# Patient Record
Sex: Male | Born: 1985 | Race: Black or African American | Hispanic: No | Smoking: Current every day smoker
Health system: Southern US, Community
[De-identification: ages and names within clinical notes are randomized; demographics above are authoritative.]

## PROBLEM LIST (undated history)

## (undated) DIAGNOSIS — R7989 Other specified abnormal findings of blood chemistry: Secondary | ICD-10-CM

## (undated) DIAGNOSIS — W19XXXA Unspecified fall, initial encounter: Secondary | ICD-10-CM

## (undated) DIAGNOSIS — M2141 Flat foot [pes planus] (acquired), right foot: Secondary | ICD-10-CM

## (undated) DIAGNOSIS — R55 Syncope and collapse: Secondary | ICD-10-CM

## (undated) DIAGNOSIS — R778 Other specified abnormalities of plasma proteins: Secondary | ICD-10-CM

## (undated) DIAGNOSIS — J9859 Other diseases of mediastinum, not elsewhere classified: Secondary | ICD-10-CM

## (undated) DIAGNOSIS — S069X9A Unspecified intracranial injury with loss of consciousness of unspecified duration, initial encounter: Secondary | ICD-10-CM

## (undated) DIAGNOSIS — K5090 Crohn's disease, unspecified, without complications: Secondary | ICD-10-CM

## (undated) DIAGNOSIS — M545 Low back pain, unspecified: Secondary | ICD-10-CM

## (undated) DIAGNOSIS — F4310 Post-traumatic stress disorder, unspecified: Secondary | ICD-10-CM

## (undated) HISTORY — DX: Low back pain, unspecified: M54.50

## (undated) HISTORY — DX: Flat foot (pes planus) (acquired), right foot: M21.41

## (undated) HISTORY — DX: Crohn's disease, unspecified, without complications: K50.90

## (undated) HISTORY — DX: Other diseases of mediastinum, not elsewhere classified: J98.59

## (undated) HISTORY — DX: Syncope and collapse: R55

## (undated) HISTORY — DX: Unspecified intracranial injury with loss of consciousness of unspecified duration, initial encounter: S06.9X9A

## (undated) HISTORY — DX: Unspecified fall, initial encounter: W19.XXXA

## (undated) HISTORY — DX: Other specified abnormalities of plasma proteins: R77.8

## (undated) HISTORY — DX: Post-traumatic stress disorder, unspecified: F43.10

## (undated) HISTORY — DX: Other specified abnormal findings of blood chemistry: R79.89

---

## 2019-08-15 ENCOUNTER — Other Ambulatory Visit: Payer: Self-pay

## 2019-08-15 DIAGNOSIS — Z20822 Contact with and (suspected) exposure to covid-19: Secondary | ICD-10-CM

## 2019-08-16 LAB — NOVEL CORONAVIRUS, NAA: SARS-CoV-2, NAA: NOT DETECTED

## 2020-05-02 ENCOUNTER — Other Ambulatory Visit: Payer: Self-pay

## 2020-05-29 DIAGNOSIS — J9859 Other diseases of mediastinum, not elsewhere classified: Secondary | ICD-10-CM | POA: Insufficient documentation

## 2020-05-30 ENCOUNTER — Other Ambulatory Visit: Payer: Self-pay | Admitting: *Deleted

## 2020-05-30 ENCOUNTER — Other Ambulatory Visit: Payer: Self-pay

## 2020-05-30 ENCOUNTER — Institutional Professional Consult (permissible substitution) (INDEPENDENT_AMBULATORY_CARE_PROVIDER_SITE_OTHER): Payer: No Typology Code available for payment source | Admitting: Thoracic Surgery (Cardiothoracic Vascular Surgery)

## 2020-05-30 ENCOUNTER — Encounter: Payer: Self-pay | Admitting: Thoracic Surgery (Cardiothoracic Vascular Surgery)

## 2020-05-30 VITALS — BP 140/67 | HR 90 | Temp 98.2°F | Resp 20 | Ht 73.0 in | Wt 200.0 lb

## 2020-05-30 DIAGNOSIS — J9859 Other diseases of mediastinum, not elsewhere classified: Secondary | ICD-10-CM | POA: Diagnosis not present

## 2020-05-30 NOTE — Progress Notes (Signed)
[address]       [place] [address]             [phone]                    Roger Stone [facility] Medical Record #[id number] Date of Birth: 1986-08-21  Referring: Concha Pyo, MD Primary Care: Patient, No Pcp Per Primary Cardiologist: No primary care provider on file.  Chief Complaint:    Chief Complaint  Patient presents with  . Mediastinal Mass    Surgical consult, CTA chest and Head CT 04/19/20     History of Present Illness:    Roger Stone 34 y.o. male referred for evaluation of anterior mediastinal mass.  In August of this year he had a syncopal episode after mowing the lawn.  Was evaluated on cross-sectional imaging he was noted to have anterior mediastinal fullness concerning for a thymic hematoma.  On review of systems he notes that he has lost approximately 8 pounds over the last year.  He states that he has a good appetite but has not really been working out much and so is unclear as to why he has lost this much weight.  He does endorse some night sweats but states that this is related to his PTSD.  His night sweats have gotten better since he started therapy.  He denies any fevers.  He also denies any abnormal bumps or fullness in any of his nodal basins.    Smoking Hx: Smokes about 5 cigarettes a day.   Zubrod Score: At the time of surgery this patient's most appropriate activity status/level should be described as: [x]     0    Normal activity, no symptoms []     1    Restricted in physical strenuous activity but ambulatory, able to do out light work []     2    Ambulatory and capable of self care, unable to do work activities, up and about               >50 % of waking hours                              []     3    Only limited self care, in bed greater than 50% of waking hours []     4    Completely disabled, no self care, confined to bed or chair []     5    Moribund   Past Medical History:  Diagnosis Date  . Crohn disease (HCC)     . Elevated troponin   . Fall   . Flat feet   . Head injury, closed, with LOC of unknown duration (HCC)   . Low back pain   . Mediastinal mass   . PTSD (post-traumatic stress disorder)   . Syncope      No family history on file.   Social History   Tobacco Use  Smoking Status Current Every Day Smoker    Social History   Substance and Sexual Activity  Alcohol Use Not on file     No Known Allergies  Current Outpatient Medications  Medication Sig Dispense Refill  . acetaminophen (TYLENOL) 325 MG tablet Take 650 mg by mouth every 6 (six) hours as needed.    ibuprofen (ADVIL) 400 MG tablet Take 400 mg by mouth every 6 (six) hours as needed.  No current facility-administered medications for this visit.    Review of Systems  Constitutional: Positive for diaphoresis and weight loss. Negative for fever and malaise/fatigue.  Respiratory: Negative.   Cardiovascular: Negative.   Neurological: Negative.   Endo/Heme/Allergies: Negative.   Psychiatric/Behavioral: The patient is nervous/anxious.      PHYSICAL EXAMINATION: BP 140/67   Pulse 90   Temp 98.2 F (36.8 C) (Skin)   Resp 20   Ht 6\' 1"  (1.854 m)   Wt 200 lb (90.7 kg)   SpO2 99% Comment: RA  BMI 26.39 kg/m  Physical Exam Constitutional:      General: He is not in acute distress.    Appearance: Normal appearance. He is not ill-appearing or toxic-appearing.  HENT:     Head: Normocephalic and atraumatic.  Eyes:     Extraocular Movements: Extraocular movements intact.     Conjunctiva/sclera: Conjunctivae normal.  Cardiovascular:     Rate and Rhythm: Normal rate and regular rhythm.     Heart sounds: No murmur heard.   Pulmonary:     Effort: Respiratory distress present.  Abdominal:     General: Abdomen is flat. There is no distension.     Palpations: Abdomen is soft.  Musculoskeletal:        General: Normal range of motion.     Cervical back: Normal range of motion and neck supple. No tenderness.   Lymphadenopathy:     Head:     Right side of head: No submental, submandibular or tonsillar adenopathy.     Left side of head: No submental, submandibular or tonsillar adenopathy.     Cervical: No cervical adenopathy.     Right cervical: No superficial cervical adenopathy.    Left cervical: No superficial cervical adenopathy.     Upper Body:     Right upper body: No supraclavicular or axillary adenopathy.     Left upper body: No axillary or pectoral adenopathy.     Lower Body: No right inguinal adenopathy. No left inguinal adenopathy.  Neurological:     Mental Status: He is alert.     Diagnostic Studies & Laboratory data:     Recent Radiology Findings:   No results found.     I have independently reviewed the above radiology studies  and reviewed the findings with the patient.   Recent Lab Findings: No results found for: WBC, HGB, HCT, PLT, GLUCOSE, CHOL, TRIG, HDL, LDLDIRECT, LDLCALC, ALT, AST, NA, K, CL, CREATININE, BUN, CO2, TSH, INR, GLUF, HGBA1C   Problem List: Anterior mediastinal mass.  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  He is also had an 80 pound weight loss over the past year.  We will need a full evaluation to rule out lymphoma, thymoma, germ cell tumor, or thymic cancer.  I have ordered tumor markers as well as a PET/CT and will see him back in clinic once all these have resulted.     I  spent 40 minutes with  the patient face to face and greater then 50% of the time was spent in counseling and coordination of care.    31 05/30/2020 4:54 PM

## 2020-06-03 ENCOUNTER — Other Ambulatory Visit: Payer: Self-pay | Admitting: Thoracic Surgery (Cardiothoracic Vascular Surgery)

## 2020-06-06 LAB — LACTATE DEHYDROGENASE: LDH: 153 U/L (ref 100–220)

## 2020-06-06 LAB — HCG, TOTAL, QUANTITATIVE: hCG, Beta Chain, Quant, S: <3 m[IU]/mL (ref <5)

## 2020-06-06 LAB — ACETYLCHOLINE RECEPTOR, BLOCKING: ACHR Blocking Abs: <15 % Inhibition (ref <15)

## 2020-06-06 LAB — AFP TUMOR MARKER: AFP-Tumor Marker: 4.1 ng/mL (ref <6.1)

## 2020-06-11 ENCOUNTER — Ambulatory Visit (HOSPITAL_COMMUNITY)
Admission: RE | Admit: 2020-06-11 | Discharge: 2020-06-11 | Disposition: A | Discharge status: Home / Self Care | Payer: No Typology Code available for payment source | Source: Ambulatory Visit | Attending: Thoracic Surgery (Cardiothoracic Vascular Surgery) | Admitting: Thoracic Surgery (Cardiothoracic Vascular Surgery)

## 2020-06-11 ENCOUNTER — Other Ambulatory Visit: Payer: Self-pay

## 2020-06-11 DIAGNOSIS — J9859 Other diseases of mediastinum, not elsewhere classified: Secondary | ICD-10-CM | POA: Insufficient documentation

## 2020-06-11 LAB — GLUCOSE, CAPILLARY: Glucose-Capillary: 93 mg/dL (ref 70–99)

## 2020-06-11 MED ORDER — FLUDEOXYGLUCOSE F - 18 (FDG) INJECTION
9.9000 | Freq: Once | INTRAVENOUS | Status: AC | PRN
  Administered 2020-06-11: 9.9 via INTRAVENOUS

## 2020-06-12 ENCOUNTER — Encounter: Payer: Self-pay | Admitting: Thoracic Surgery (Cardiothoracic Vascular Surgery)

## 2020-06-12 ENCOUNTER — Other Ambulatory Visit: Payer: Self-pay | Admitting: Thoracic Surgery (Cardiothoracic Vascular Surgery)

## 2020-06-12 ENCOUNTER — Ambulatory Visit (INDEPENDENT_AMBULATORY_CARE_PROVIDER_SITE_OTHER): Payer: No Typology Code available for payment source | Admitting: Thoracic Surgery (Cardiothoracic Vascular Surgery)

## 2020-06-12 VITALS — BP 159/76 | HR 97 | Temp 97.4°F | Resp 18 | Ht 73.0 in | Wt 195.6 lb

## 2020-06-12 DIAGNOSIS — J9859 Other diseases of mediastinum, not elsewhere classified: Secondary | ICD-10-CM | POA: Diagnosis not present

## 2020-06-12 NOTE — Progress Notes (Signed)
     [address]       [name] [address]             [phone]       Mr. Weisbecker comes in for review of all of his studies.  His LDH, AFP, and beta-hCG were all within normal limits.  His PET scan showed an enlarged thymus gland with mild FDG uptake.  There was no significant lymphadenopathy that was avid on PET scan.  I discussed these results with him and his wife.  I think that lymphoma is lower on the differential.  We need to delineate whether he has thymic hyperplasia or thymoma.  Again his only symptoms are an 80 pound weight loss over the last year, and night sweats which his wife states has been present for the past 9 years.  He also has a history of obstructive sleep apnea as well as PTSD.  I have ordered an MRI to focus on the thymus gland.  Once completed his case will be presented at our tumor board.  I will see him back to discuss the results.  Kember Boch Keane Scrape

## 2020-06-17 ENCOUNTER — Encounter (HOSPITAL_COMMUNITY): Payer: Self-pay

## 2020-06-17 ENCOUNTER — Other Ambulatory Visit: Payer: Self-pay | Admitting: Thoracic Surgery (Cardiothoracic Vascular Surgery)

## 2020-06-17 ENCOUNTER — Ambulatory Visit (HOSPITAL_COMMUNITY)
Admission: RE | Admit: 2020-06-17 | Discharge: 2020-06-17 | Disposition: A | Discharge status: Home / Self Care | Payer: No Typology Code available for payment source | Source: Ambulatory Visit | Attending: Thoracic Surgery (Cardiothoracic Vascular Surgery) | Admitting: Thoracic Surgery (Cardiothoracic Vascular Surgery)

## 2020-06-17 ENCOUNTER — Other Ambulatory Visit: Payer: Self-pay

## 2020-06-17 DIAGNOSIS — J9859 Other diseases of mediastinum, not elsewhere classified: Secondary | ICD-10-CM

## 2020-06-17 NOTE — Progress Notes (Signed)
Spoke to Dr. Ashley Murrain in radiology about exam order vs. Reason for exam. Per Dr. Ashley Murrain, exam needs to be MRI Chest W/WO. Exam was ordered MRA Chest W/WO. Dr. Isidore Moos made aware. Pt will need to reschedule after order is changed.

## 2020-06-18 ENCOUNTER — Other Ambulatory Visit: Payer: Self-pay | Admitting: Thoracic Surgery (Cardiothoracic Vascular Surgery)

## 2020-06-20 ENCOUNTER — Telehealth: Payer: No Typology Code available for payment source | Admitting: Thoracic Surgery (Cardiothoracic Vascular Surgery)

## 2020-06-20 ENCOUNTER — Other Ambulatory Visit: Payer: Self-pay

## 2020-06-21 ENCOUNTER — Ambulatory Visit (HOSPITAL_COMMUNITY)
Admission: RE | Admit: 2020-06-21 | Discharge: 2020-06-21 | Disposition: A | Discharge status: Home / Self Care | Payer: No Typology Code available for payment source | Source: Ambulatory Visit | Attending: Thoracic Surgery (Cardiothoracic Vascular Surgery) | Admitting: Thoracic Surgery (Cardiothoracic Vascular Surgery)

## 2020-06-21 ENCOUNTER — Other Ambulatory Visit: Payer: Self-pay

## 2020-06-21 DIAGNOSIS — J9859 Other diseases of mediastinum, not elsewhere classified: Secondary | ICD-10-CM | POA: Diagnosis not present

## 2020-06-21 MED ORDER — GADOBUTROL 1 MMOL/ML IV SOLN
7.5000 mL | Freq: Once | INTRAVENOUS | Status: AC | PRN
  Administered 2020-06-21: 7.5 mL via INTRAVENOUS

## 2020-06-27 ENCOUNTER — Telehealth: Payer: No Typology Code available for payment source | Admitting: Thoracic Surgery (Cardiothoracic Vascular Surgery)

## 2020-08-15 ENCOUNTER — Other Ambulatory Visit: Payer: Self-pay | Admitting: Thoracic Surgery (Cardiothoracic Vascular Surgery)

## 2020-08-15 DIAGNOSIS — J9859 Other diseases of mediastinum, not elsewhere classified: Secondary | ICD-10-CM

## 2020-09-09 ENCOUNTER — Other Ambulatory Visit: Payer: No Typology Code available for payment source

## 2020-09-10 ENCOUNTER — Ambulatory Visit
Admission: RE | Admit: 2020-09-10 | Discharge: 2020-09-10 | Disposition: A | Discharge status: Home / Self Care | Payer: No Typology Code available for payment source | Source: Ambulatory Visit | Attending: Thoracic Surgery (Cardiothoracic Vascular Surgery) | Admitting: Thoracic Surgery (Cardiothoracic Vascular Surgery)

## 2020-09-10 DIAGNOSIS — J9859 Other diseases of mediastinum, not elsewhere classified: Secondary | ICD-10-CM

## 2020-09-10 MED ORDER — IOPAMIDOL (ISOVUE-300) INJECTION 61%
75.0000 mL | Freq: Once | INTRAVENOUS | Status: AC | PRN
  Administered 2020-09-10: 75 mL via INTRAVENOUS

## 2020-09-26 ENCOUNTER — Other Ambulatory Visit: Payer: Self-pay

## 2020-09-26 ENCOUNTER — Encounter: Payer: Self-pay | Admitting: Thoracic Surgery (Cardiothoracic Vascular Surgery)

## 2020-09-26 ENCOUNTER — Telehealth (INDEPENDENT_AMBULATORY_CARE_PROVIDER_SITE_OTHER): Payer: No Typology Code available for payment source | Admitting: Thoracic Surgery (Cardiothoracic Vascular Surgery)

## 2020-09-26 DIAGNOSIS — J9859 Other diseases of mediastinum, not elsewhere classified: Secondary | ICD-10-CM | POA: Diagnosis not present

## 2020-09-26 NOTE — Progress Notes (Signed)
     [address]       [name] [address]             [phone]       Today's visit was completed via a real-time telehealth (see specific modality noted below). The patient/authorized person provided oral consent at the time of the visit to engage in a telemedicine encounter with the present provider at [facility]. The patient/authorized person was informed of the potential benefits, limitations, and risks of telemedicine. The patient/authorized person expressed understanding that the laws that protect confidentiality also apply to telemedicine. The patient/authorized person acknowledged understanding that telemedicine does not provide emergency services and that he or she would need to call 911 or proceed to the nearest hospital for help if such a need arose.  . Total time spent in the clinical discussion 5 minutes. . Telehealth Modality: Phone visit (audio only)  I had a telephone visit with Roger Stone.  I explained the results of his CT scan.  The mediastinal mass is slightly smaller but it is recommended that he undergo repeat imaging for further surveillance in 3 months.  Patient states that he has not had any new symptoms or complaints since his last visit.  He is agreeable with another scan in the next 3 months.

## 2020-11-13 ENCOUNTER — Other Ambulatory Visit: Payer: Self-pay | Admitting: Thoracic Surgery (Cardiothoracic Vascular Surgery)

## 2020-11-13 DIAGNOSIS — J9859 Other diseases of mediastinum, not elsewhere classified: Secondary | ICD-10-CM

## 2021-01-02 ENCOUNTER — Other Ambulatory Visit: Payer: No Typology Code available for payment source

## 2021-01-02 ENCOUNTER — Telehealth: Payer: Self-pay | Admitting: Thoracic Surgery (Cardiothoracic Vascular Surgery)

## 2021-11-29 IMAGING — PT NM PET TUM IMG INITIAL (PI) SKULL BASE T - THIGH
7 series · 25 of 25 positions shown · non-contrast
Comparison: None.

CLINICAL DATA: Initial treatment strategy for mediastinal mass.

EXAM:
NUCLEAR MEDICINE PET SKULL BASE TO THIGH
TECHNIQUE: 9.95 mCi F-18 FDG was injected intravenously. Full-ring PET imaging
was performed from the skull base to thigh after the radiotracer. CT
data was obtained and used for attenuation correction and anatomic
localization.
Fasting blood glucose: 93 mg/dl

[Series 3: pet sk_thigh ac · axial · 5.0mm · 4.07mm/px · z∈[-945,+11]mm · 5 of 240 slices shown]
[im 1/240]
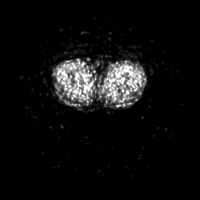
[im 60/240]
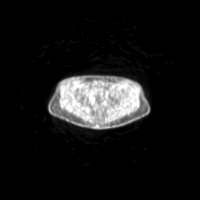
[im 120/240]
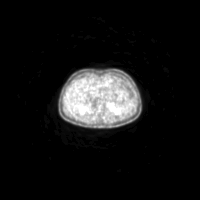
[im 180/240]
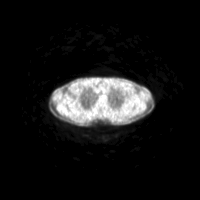
[im 240/240]
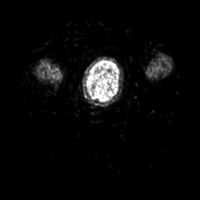

[Series 4: ct sk_thigh 5.0 bf37 · axial · 5.0mm · 0.98mm/px · z∈[-945,+11]mm · 5 of 240 slices shown]
[im 1/240]
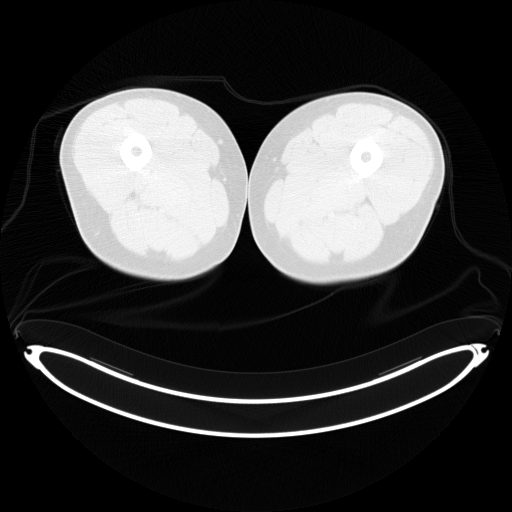
[im 60/240]
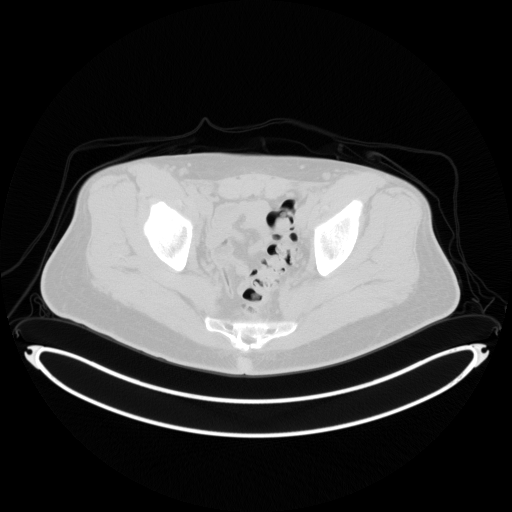
[im 120/240]
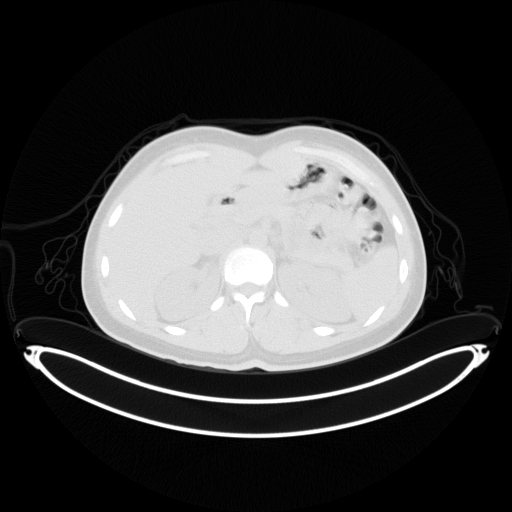
[im 180/240]
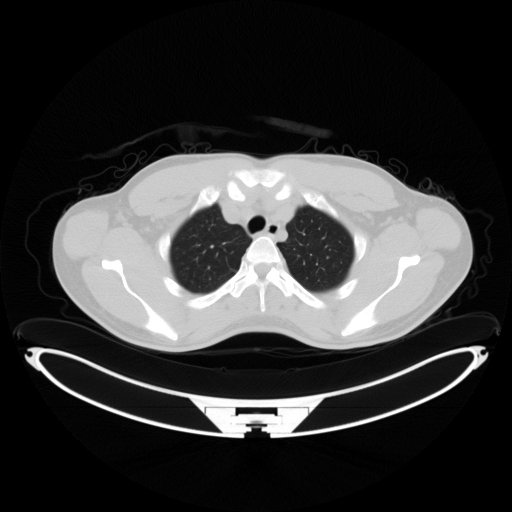
[im 240/240  brain]
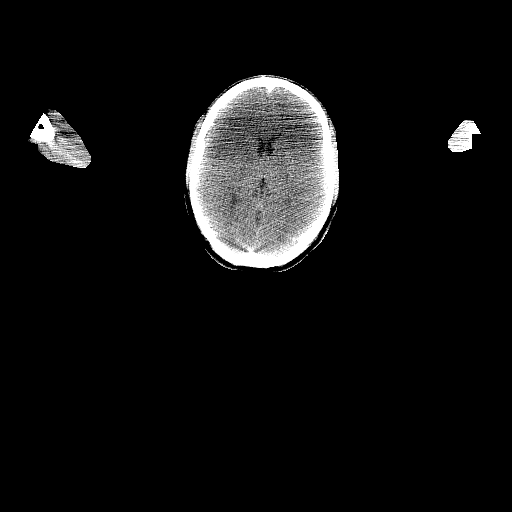

[Series 5: pet sk_thigh nac · axial · 5.0mm · 4.07mm/px · z∈[-945,+11]mm · 6 of 240 slices shown]
[im 1/240]
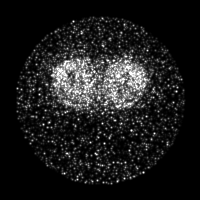
[im 48/240]
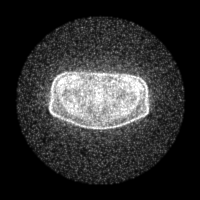
[im 96/240]
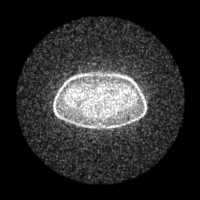
[im 144/240]
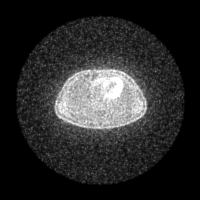
[im 192/240]
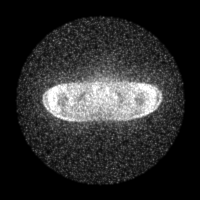
[im 240/240]
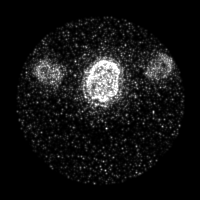

[Series 8: ct sk_thigh 5.0 br59 lung_bone · axial · 5.0mm · 0.73mm/px · z∈[-471,-163]mm · 2 of 78 slices shown]
[im 1/78]
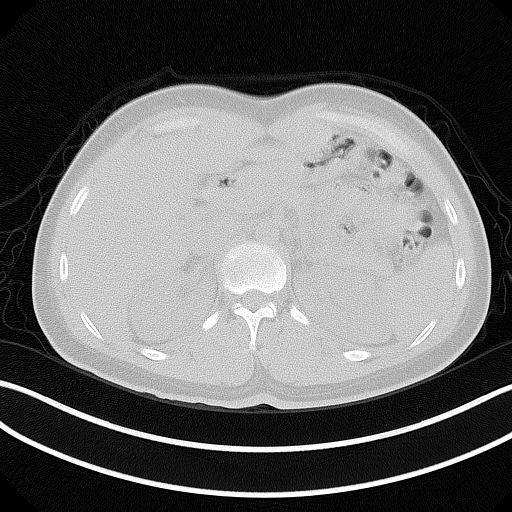
[im 78/78]
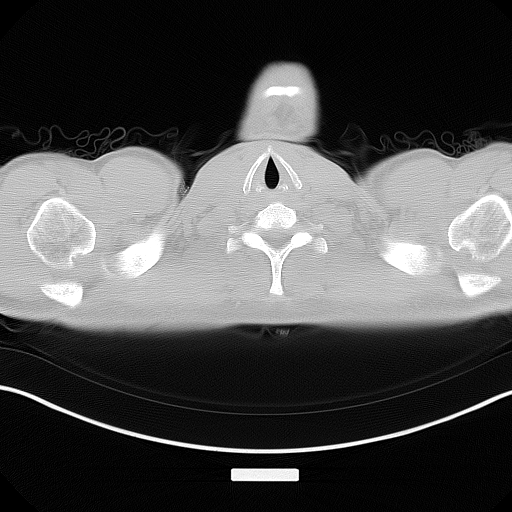

[Series 603: <mip collection> · coronal · 1.98mm/px · 1 of 32 slices shown]
[im 1/32]
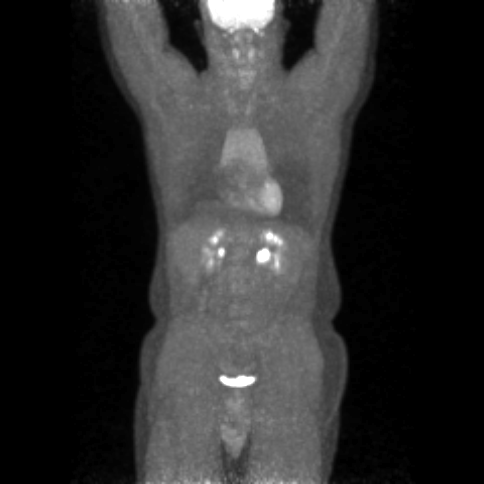

[Series 604: fused cor · 1 of 21 slices shown]
[im 1/21]
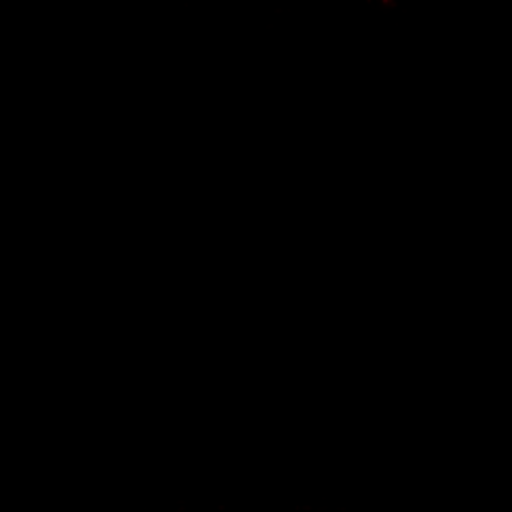

[Series 605: range-ct sk_thigh 5.0 bf37-tra-<alpha range> · 5 of 222 slices shown]
[im 1/222]
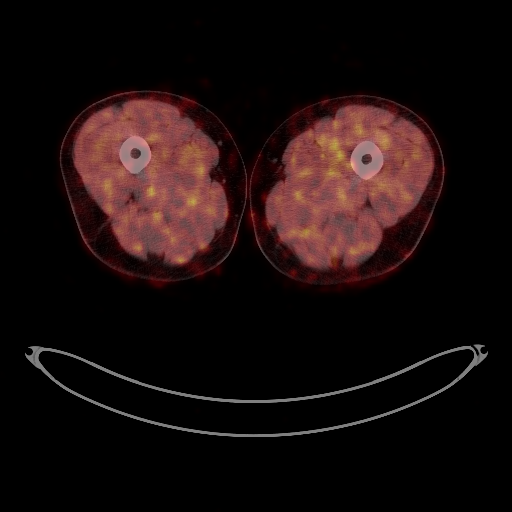
[im 56/222]
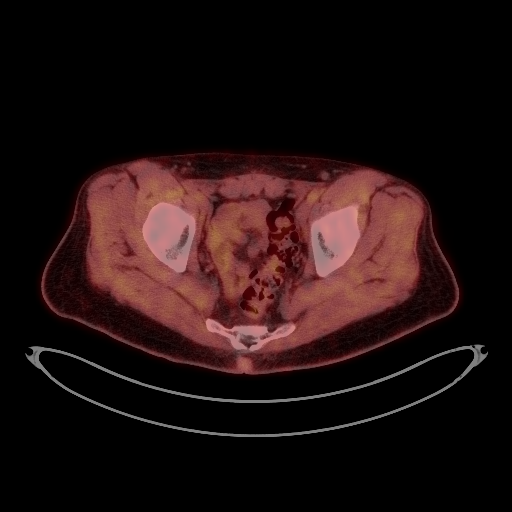
[im 111/222]
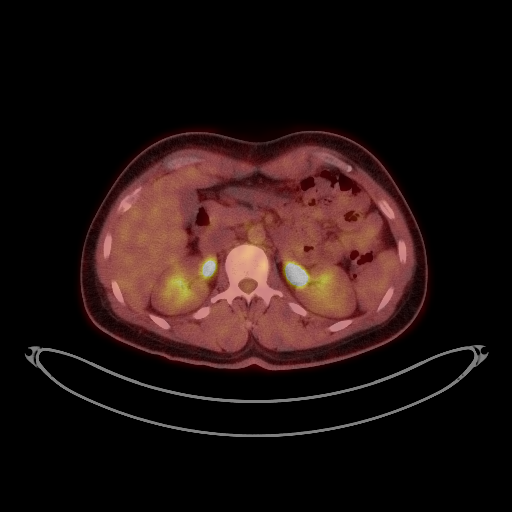
[im 166/222]
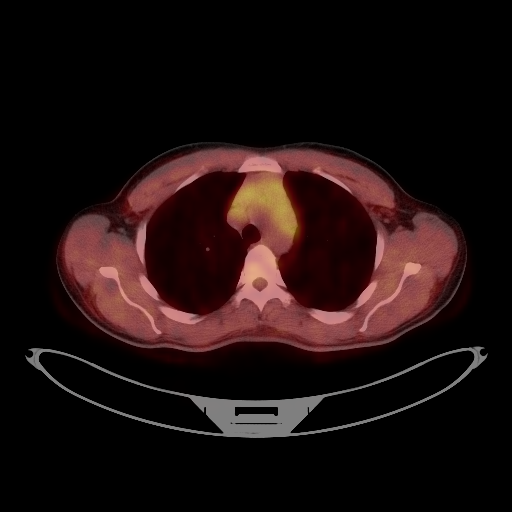
[im 222/222]
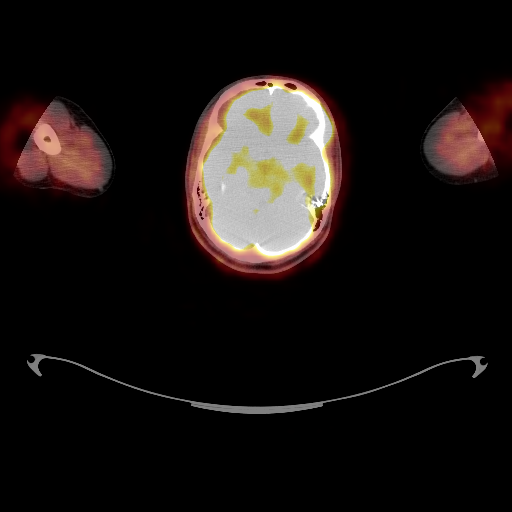

[25 of 25 positions shown; findings below may reference images not displayed]

FINDINGS: Mediastinal blood pool activity: SUV max

Liver activity: SUV max NA

NECK: No hypermetabolic lymph nodes in the neck.

Incidental CT findings: none

CHEST: There are no enlarged or FDG avid axillary, supraclavicular,
mediastinal or hilar lymph nodes. There is abnormal increased soft
tissue within the anterior mediastinum is identified, which appears
non lobular and conforms to the expected configuration of the thymic
gland. The left lobe has a width of 7.3 cm and a thickness of
cm, image 76/4. The right lobe has a width of 6.2 cm and a maximum
thickness of 2.7 cm. The corresponding SUV max is equal to 3.69.

Incidental CT findings: No pleural effusion, airspace consolidation
or atelectasis. Small nonspecific subpleural nodule in the right
middle lobe measures 3 mm, image 56/8. Subpleural nodular density
within the medial aspect of the right apex has a mean diameter of 5
mm, image [DATE].

ABDOMEN/PELVIS: No abnormal FDG uptake within the liver, pancreas,
spleen, or adrenal glands. No FDG avid abdominopelvic lymph nodes
identified.

Incidental CT findings: none

SKELETON: No focal hypermetabolic activity to suggest skeletal
metastasis.

Incidental CT findings: none
IMPRESSION: 1. There is abnormal increased soft tissue within the anterior
mediastinum which conforms to the normal configuration of the thymic
gland without focal nodularity. Mild FDG uptake is identified on the
PET images within SUV max of 3.69. Normal thymic gland typically has
SUV max of 1.0-1.8. Primary differential considerations include
thymic hyperplasia, lymphoma, or thymoma. If further imaging is
clinically indicated MRI of the chest with inphase and out of phase
sequences may be helpful to distinguish between thymic hyperplasia
and thymic neoplasm. Additionally, clinical correlation for any
causes for thymic hyperplasia is advised including.

## 2023-09-12 ENCOUNTER — Other Ambulatory Visit (HOSPITAL_COMMUNITY): Payer: Self-pay | Admitting: Nurse Practitioner

## 2023-09-12 DIAGNOSIS — E0590 Thyrotoxicosis, unspecified without thyrotoxic crisis or storm: Secondary | ICD-10-CM

## 2023-09-26 ENCOUNTER — Encounter (HOSPITAL_COMMUNITY): Payer: No Typology Code available for payment source

## 2023-09-26 ENCOUNTER — Encounter (HOSPITAL_COMMUNITY): Admission: RE | Admit: 2023-09-26 | Payer: No Typology Code available for payment source | Source: Ambulatory Visit

## 2023-09-27 ENCOUNTER — Encounter (HOSPITAL_COMMUNITY): Payer: No Typology Code available for payment source

## 2023-10-17 ENCOUNTER — Encounter (HOSPITAL_COMMUNITY)
Admission: RE | Admit: 2023-10-17 | Discharge: 2023-10-17 | Disposition: A | Discharge status: Home / Self Care | Payer: No Typology Code available for payment source | Source: Ambulatory Visit | Attending: Nurse Practitioner | Admitting: Nurse Practitioner

## 2023-10-17 ENCOUNTER — Ambulatory Visit (HOSPITAL_COMMUNITY)
Admission: RE | Admit: 2023-10-17 | Discharge: 2023-10-17 | Disposition: A | Discharge status: Home / Self Care | Payer: No Typology Code available for payment source | Source: Ambulatory Visit | Attending: Nurse Practitioner | Admitting: Nurse Practitioner

## 2023-10-17 DIAGNOSIS — E0590 Thyrotoxicosis, unspecified without thyrotoxic crisis or storm: Secondary | ICD-10-CM | POA: Diagnosis present

## 2023-10-17 MED ORDER — SODIUM IODIDE I-123 7.4 MBQ CAPS
445.0000 | ORAL_CAPSULE | Freq: Once | ORAL | Status: AC
  Administered 2023-10-17: 445 via ORAL

## 2023-10-18 ENCOUNTER — Encounter (HOSPITAL_COMMUNITY)
Admission: RE | Admit: 2023-10-18 | Discharge: 2023-10-18 | Disposition: A | Discharge status: Home / Self Care | Payer: No Typology Code available for payment source | Source: Ambulatory Visit | Attending: Nurse Practitioner | Admitting: Nurse Practitioner
